# Patient Record
Sex: Male | Born: 2005 | Race: White | Hispanic: No | Marital: Single | Smoking: Never smoker
Health system: Southern US, Community
[De-identification: ages and names within clinical notes are randomized; demographics above are authoritative.]

## PROBLEM LIST (undated history)

## (undated) DIAGNOSIS — R569 Unspecified convulsions: Secondary | ICD-10-CM

## (undated) DIAGNOSIS — F845 Asperger's syndrome: Secondary | ICD-10-CM

## (undated) DIAGNOSIS — F32A Depression, unspecified: Secondary | ICD-10-CM

## (undated) HISTORY — PX: CIRCUMCISION: SUR203

## (undated) HISTORY — DX: Unspecified convulsions: R56.9

## (undated) HISTORY — DX: Depression, unspecified: F32.A

---
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
## 2023-02-22 ENCOUNTER — Encounter: Payer: Self-pay | Admitting: Family Medicine

## 2023-02-22 ENCOUNTER — Ambulatory Visit (INDEPENDENT_AMBULATORY_CARE_PROVIDER_SITE_OTHER): Payer: MEDICAID | Admitting: Family Medicine

## 2023-02-22 VITALS — BP 118/72 | HR 81 | Resp 99 | Ht 66.56 in | Wt 136.2 lb

## 2023-02-22 DIAGNOSIS — F840 Autistic disorder: Secondary | ICD-10-CM | POA: Diagnosis not present

## 2023-02-22 DIAGNOSIS — F410 Panic disorder [episodic paroxysmal anxiety] without agoraphobia: Secondary | ICD-10-CM | POA: Diagnosis not present

## 2023-02-22 NOTE — Progress Notes (Signed)
Subjective:    Patient ID: Holly Bodily II, male    DOB: 06-22-2005, 18 y.o.   MRN: [id number]  Patient has a history of autism.  He also has a history of anxiety and depression.  He is currently taking BuSpar to help manage anxiety.  This seems to be working well for him.  He denies any depression.  He denies any panic attacks.  He denies mania.  He is currently on Depakote for seizure prophylaxis.  His neurologist has discussed with him weaning off the Depakote.  He is concerned that he may need to take more BuSpar.  He is concerned that decreasing his Depakote may trigger increasing anxiety. Past Medical History:  Diagnosis Date   Asperger's syndrome    Depression    Seizures (HCC)    Past Surgical History:  Procedure Laterality Date   CIRCUMCISION     Current Outpatient Medications on File Prior to Visit  Medication Sig Dispense Refill   busPIRone (BUSPAR) 7.5 MG tablet TAKE 1 TABLET BY MOUTH 2 TIMES DAILY. 60 tablet 2   divalproex (DEPAKOTE ER) 500 MG 24 hr tablet Take 1 tablet (500 mg total) by mouth at bedtime. 30 tablet 6   No current facility-administered medications on file prior to visit.   No Known Allergies Social History   Socioeconomic History   Marital status: Single    Spouse name: Not on file   Number of children: Not on file   Years of education: Not on file   Highest education level: Not on file  Occupational History   Not on file  Tobacco Use   Smoking status: Never    Passive exposure: Never   Smokeless tobacco: Never  Vaping Use   Vaping status: Never Used  Substance and Sexual Activity   Alcohol use: No   Drug use: No   Sexual activity: Never  Other Topics Concern   Not on file  Social History Narrative   Patient lives at home with grandmother, grandfather, and dad.    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     He enjoys playing games, watching youtube, and watching movies.    Social Drivers of Research scientist (physical sciences) Strain: Not on file  Food Insecurity: Not on file  Transportation Needs: Not on file  Physical Activity: Not on file  Stress: Not on file  Social Connections: Unknown (06/16/2021)   Received from [facility]   Social Network    Social Network: Not on file  Intimate Partner Violence: Unknown (05/08/2021)   Received from [facility], [facility]   HITS    Physically Hurt: Not on file    Insult or Talk Down To: Not on file    Threaten Physical Harm: Not on file    Scream or Curse: Not on file   Family History  Problem Relation Age of Onset   Bipolar disorder Mother    Migraines Neg Hx    Seizures Neg Hx    Autism Neg Hx    ADD / ADHD Neg Hx    Anxiety disorder Neg Hx    Depression Neg Hx    Schizophrenia Neg Hx        Review of Systems  All other systems reviewed and are negative.      Objective:   Physical Exam Vitals reviewed.  Constitutional:      General: He is not in acute distress.    Appearance: Normal appearance. He is  normal weight. He is not ill-appearing, toxic-appearing or diaphoretic.  HENT:     Head: Normocephalic and atraumatic.     Mouth/Throat:     Dentition: Dental tenderness and gingival swelling present. No dental abscesses or gum lesions.   Eyes:     Conjunctiva/sclera: Conjunctivae normal.     Pupils: Pupils are equal, round, and reactive to light.  Cardiovascular:     Rate and Rhythm: Normal rate and regular rhythm.     Heart sounds: Normal heart sounds. No murmur heard.    No gallop.  Pulmonary:     Effort: Pulmonary effort is normal. No respiratory distress.     Breath sounds: Normal breath sounds. No wheezing, rhonchi or rales.  Abdominal:     General: There is no distension.     Tenderness: There is no abdominal tenderness. There is no guarding.  Musculoskeletal:     Right lower leg: No edema.     Left lower leg: No edema.  Skin:    Findings: No rash.  Neurological:     General: No focal deficit  present.     Mental Status: He is alert and oriented to person, place, and time. Mental status is at baseline.     Cranial Nerves: No cranial nerve deficit.     Coordination: Coordination normal.  Psychiatric:        Mood and Affect: Mood normal. Affect is blunt.        Speech: Speech normal.        Behavior: Behavior normal. Behavior is cooperative.        Thought Content: Thought content normal.        Cognition and Memory: Cognition and memory normal.        Judgment: Judgment normal. Judgment is not impulsive or inappropriate.           Assessment & Plan:  Panic attacks  Autism disorder Continue BuSpar 7.5 mg twice daily.  As long as his anxiety and depression are well-controlled I see no reason to increase BuSpar at the present time.  I have recommended that we monitor the patient and after he stops the Depakote, if he develops increasing anxiety, we could certainly increase the BuSpar at that point but I see no clinical indication to do so at the present time.  I did write a letter on the patient's behalf explaining his hospitalization in February 2024 for C. difficile colitis.  The patient missed school while he was hospitalized for 3 days and during the recovery..  During that time he had a drop 2 of his classes.  This has impacted his financial aid.  His grandmother is appealing this decision

## 2023-05-17 ENCOUNTER — Ambulatory Visit (INDEPENDENT_AMBULATORY_CARE_PROVIDER_SITE_OTHER): Payer: MEDICAID | Admitting: Neurology

## 2023-05-17 ENCOUNTER — Encounter (INDEPENDENT_AMBULATORY_CARE_PROVIDER_SITE_OTHER): Payer: Self-pay | Admitting: Neurology

## 2023-05-17 VITALS — BP 120/70 | HR 66 | Ht 66.93 in | Wt 131.4 lb

## 2023-05-17 DIAGNOSIS — F411 Generalized anxiety disorder: Secondary | ICD-10-CM

## 2023-05-17 DIAGNOSIS — G40409 Other generalized epilepsy and epileptic syndromes, not intractable, without status epilepticus: Secondary | ICD-10-CM

## 2023-05-17 DIAGNOSIS — F845 Asperger's syndrome: Secondary | ICD-10-CM | POA: Diagnosis not present

## 2023-05-17 NOTE — Patient Instructions (Signed)
 Decrease the dose of Depakote to 1 tablet every other day for 2 weeks and then discontinue medication We will schedule EEG over the next week or 2 If the EEG is normal I will call to let you know and if the EEG is abnormal then we will restart medication No follow-up visit needed at this time

## 2023-05-17 NOTE — Progress Notes (Signed)
 Patient: Daniel Cross MRN: [id number] Sex: male DOB: April 09, 2005  Provider: Keturah Shavers, MD Location of Care: [facility]  Note type: Routine return visit  Referral Source: Bernadette Hoit, MD History from: patient, [facility], The chart, and grandmother Chief Complaint: Seizures  History of Present Illness: Daniel Cross is a 18 y.o. male is here for follow-up management of seizure disorder. He has diagnosis of Asperger syndrome, ADHD, behavioral issues and generalized myoclonic epilepsy, has been on Depakote with good seizure control and no clinical seizure activity for about 3 years. He was on moderate dose of Depakote and in mid 2024 the dose of medication decreased to 1000 mg daily and at that time the level of Depakote was 44.7.  His previous EEG in October 2024 was normal but the EEG prior to that in 2022 showed bursts of generalized discharges. On his last visit in October since he had been seizure-free for more than 2 years and he is EEG was normal prior to that, I recommended to decrease the dose of Depakote to 500 mg daily for a few months and see how he does and then decide if we would be able to discontinue medication after having another EEG and if he remains seizure-free. Since his last visit he has been doing well without having any seizure and he has been taking low-dose Depakote at 500 mg every night without any issues.  He usually sleeps well without any difficulty and he and his grandmother do not have any other complaints or concerns at this time.   Review of Systems: Review of system as per HPI, otherwise negative.  Past Medical History:  Diagnosis Date   Asperger's syndrome    Depression    Seizures (HCC)    Hospitalizations: No., Head Injury: No., Nervous System Infections: No., Immunizations up to date: Yes.     Surgical History Past Surgical History:  Procedure Laterality Date   CIRCUMCISION      Family History family history includes  Bipolar disorder in his mother.   Social History Social History   Socioeconomic History   Marital status: Single    Spouse name: Not on file   Number of children: Not on file   Years of education: Not on file   Highest education level: Not on file  Occupational History   Not on file  Tobacco Use   Smoking status: Never    Passive exposure: Never   Smokeless tobacco: Never  Vaping Use   Vaping status: Never Used  Substance and Sexual Activity   Alcohol use: No   Drug use: No   Sexual activity: Never  Other Topics Concern   Not on file  Social History Narrative   Patient lives at home with grandmother, grandfather, and dad.    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     He enjoys playing games, watching youtube, and watching movies.    Social Drivers of Corporate investment banker Strain: Not on file  Food Insecurity: Not on file  Transportation Needs: Not on file  Physical Activity: Not on file  Stress: Not on file  Social Connections: Unknown (06/16/2021)   Received from [facility]   Social Network    Social Network: Not on file     No Known Allergies  Physical Exam BP 120/70   Pulse 66   Ht 5' 6.93" (1.7 m)   Wt 131 lb 6.3 oz (59.6 kg)   BMI 20.62 kg/m  Gen:  Awake, alert, not in distress Skin: No rash, No neurocutaneous stigmata. HEENT: Normocephalic, no dysmorphic features, no conjunctival injection, nares patent, mucous membranes moist, oropharynx clear. Neck: Supple, no meningismus. No focal tenderness. Resp: Clear to auscultation bilaterally CV: Regular rate, normal S1/S2, no murmurs, no rubs Abd: BS present, abdomen soft, non-tender, non-distended. No hepatosplenomegaly or mass Ext: Warm and well-perfused. No deformities, no muscle wasting, ROM full.  Neurological Examination: MS: Awake, alert, interactive. Normal eye contact, answered the questions appropriately, speech was fluent,  Normal comprehension.   Attention and concentration were normal. Cranial Nerves: Pupils were equal and reactive to light ( 5-23mm);  normal fundoscopic exam with sharp discs, visual field full with confrontation test; EOM normal, no nystagmus; no ptsosis, no double vision, intact facial sensation, face symmetric with full strength of facial muscles, hearing intact to finger rub bilaterally, palate elevation is symmetric, tongue protrusion is symmetric with full movement to both sides.  Sternocleidomastoid and trapezius are with normal strength. Tone-Normal Strength-Normal strength in all muscle groups DTRs-  Biceps Triceps Brachioradialis Patellar Ankle  R 2+ 2+ 2+ 2+ 2+  L 2+ 2+ 2+ 2+ 2+   Plantar responses flexor bilaterally, no clonus noted Sensation: Intact to light touch, temperature, vibration, Romberg negative. Coordination: No dysmetria on FTN test. No difficulty with balance. Gait: Normal walk and run. Tandem gait was normal. Was able to perform toe walking and heel walking without difficulty.   Assessment and Plan 1. Myoclonic seizure disorder (HCC)   2. Asperger syndrome   3. Anxiety state    This is a 18 year old male with history of Asperger and some behavioral issues and anxiety and myoclonic seizure but has been seizure-free for 3 years with the last EEG was normal.  He has no focal findings on his neurological examination at this time. Since he is on very low-dose Depakote at 500 mg daily and without having any seizure for the past 3 years and with the last EEG normal, I think we would be able to taper and discontinue medication so I recommended to decrease the dose of Depakote to 500 mg every other night for 2 weeks and then discontinue the medication Meanwhile we will schedule for an EEG with sleep deprivation over the next couple of weeks and if there is any abnormality then we may restart medication again. If the EEG is normal and he continues to be seizure-free, no follow-up visit needed and he  will continue follow-up with his primary care physician.  He and his grandmother understood and agreed with the plan.  No orders of the defined types were placed in this encounter.  Orders Placed This Encounter  Procedures   Child sleep deprived EEG    Standing Status:   Future    Expiration Date:   05/16/2024

## 2023-06-08 ENCOUNTER — Ambulatory Visit (HOSPITAL_COMMUNITY)
Admission: RE | Admit: 2023-06-08 | Discharge: 2023-06-08 | Disposition: A | Discharge status: Home / Self Care | Payer: MEDICAID | Source: Ambulatory Visit

## 2023-06-08 ENCOUNTER — Other Ambulatory Visit (HOSPITAL_COMMUNITY): Payer: Self-pay

## 2023-06-08 DIAGNOSIS — F909 Attention-deficit hyperactivity disorder, unspecified type: Secondary | ICD-10-CM | POA: Insufficient documentation

## 2023-06-08 DIAGNOSIS — G40409 Other generalized epilepsy and epileptic syndromes, not intractable, without status epilepticus: Secondary | ICD-10-CM | POA: Diagnosis not present

## 2023-06-08 DIAGNOSIS — F845 Asperger's syndrome: Secondary | ICD-10-CM | POA: Diagnosis present

## 2023-06-08 DIAGNOSIS — G40309 Generalized idiopathic epilepsy and epileptic syndromes, not intractable, without status epilepticus: Secondary | ICD-10-CM | POA: Diagnosis not present

## 2023-06-08 NOTE — Progress Notes (Signed)
Sleep deprived EEG complete. Results pending. 

## 2023-06-10 ENCOUNTER — Telehealth (INDEPENDENT_AMBULATORY_CARE_PROVIDER_SITE_OTHER): Payer: Self-pay | Admitting: Neurology

## 2023-06-10 MED ORDER — DIVALPROEX SODIUM ER 500 MG PO TB24: 1000.0000 mg | ORAL_TABLET | Freq: Every day | ORAL | 6 refills | Status: AC

## 2023-06-10 NOTE — Telephone Encounter (Signed)
  Name of who is calling:  katherine   Caller's Relationship to Patient: grandmother   Best contact [phone]  Provider they see: nab   Reason for call: She is wondering could you call in the rx and can he take one instead of two she thinks it would be easier on him. She would like a call back regarding this      PRESCRIPTION REFILL ONLY  Name of prescription: depakote    Pharmacy:

## 2023-06-10 NOTE — Telephone Encounter (Signed)
 I called grandmother and discussed the EEG result which showed frequent bursts of generalized discharges off of medication for a week. I recommend to start Depakote  again at 1 tablet every night for 3 nights and then 2 tablets every night which was the same dose of medication he was on in the past. I will send a new prescription and then we will make an appointment to see him in about 6 months.  Grandmother understood and agreed.

## 2023-06-10 NOTE — Telephone Encounter (Signed)
 Contacted Timur.  Kordae stated that he was with his grandmother but she was on the phone at the time of this call.   Nas stated that he would like to take one 1,000 Mg tablet instead of two 500 Mg tablets. I informed Deshannon that the medications highest tablet is the 500 Mg tablet.   Gavynn verbalized understanding of this.   SS, CCMA

## 2023-06-10 NOTE — Procedures (Signed)
 Patient:  Daniel Cross   Sex: male  DOB:  [birth year]  Date of study:    06/08/2023              Clinical history: This is a 18 year old male with diagnosis of Asperger syndrome, ADHD and generalized myoclonic epilepsy, has been seizure-free for a couple of years. This is a follow-up EEG for evaluation of epileptiform discharges on tapering dose of medication.   Medication: Tapering dose of Depakote              Procedure: The tracing was carried out on a 32 channel digital Cadwell recorder reformatted into 16 channel montages with 1 devoted to EKG.  The 10 /20 international system electrode placement was used. Recording was done during awake state. Recording time 42 minutes.   Description of findings: Background rhythm consists of amplitude of 40 microvolt and frequency of 9-10 hertz posterior dominant rhythm. There was normal anterior posterior gradient noted. Background was well organized, continuous and symmetric with no focal slowing. There was muscle artifact noted. Hyperventilation resulted in slowing of the background activity. Photic stimulation using stepwise increase in photic frequency resulted in bilateral symmetric driving response. Throughout the recording there were fairly frequent bursts of high amplitude generalized discharges noted, more frequent during photic stimulation.  There were no transient rhythmic activities or electrographic seizures noted. One lead EKG rhythm strip revealed sinus rhythm at a rate of 60 bpm.  Impression: This EEG is significantly abnormal due to frequent bursts of high amplitude generalized discharges as described. The findings are consistent with generalized seizure disorder, associated with lower seizure threshold and require careful clinical correlation.   Ventura Gins, MD

## 2023-08-16 ENCOUNTER — Telehealth: Payer: Self-pay | Admitting: Family Medicine

## 2023-08-16 ENCOUNTER — Other Ambulatory Visit: Payer: Self-pay | Admitting: Family Medicine

## 2023-08-16 MED ORDER — BUSPIRONE HCL 7.5 MG PO TABS: 7.5000 mg | ORAL_TABLET | Freq: Two times a day (BID) | ORAL | 2 refills | Status: AC

## 2023-08-16 NOTE — Telephone Encounter (Signed)
 Prescription Request  08/16/2023  LOV: 02/22/2023  What is the name of the medication or equipment?   busPIRone  (BUSPAR ) 7.5 MG tablet   Have you contacted your pharmacy to request a refill? Yes   Which pharmacy would you like this sent to?  [id number] [address], [place] - [address] [place] [address] Phone: [phone] Fax: [phone]    Patient notified that their request is being sent to the clinical staff for review and that they should receive a response within 2 business days.   Please advise pharmacist.
